# Patient Record
Sex: Male | Born: 1965
Health system: Southern US, Community
[De-identification: ages and names within clinical notes are randomized; demographics above are authoritative.]

## PROBLEM LIST (undated history)

## (undated) DIAGNOSIS — M1990 Unspecified osteoarthritis, unspecified site: Secondary | ICD-10-CM

## (undated) DIAGNOSIS — I10 Essential (primary) hypertension: Secondary | ICD-10-CM

## (undated) HISTORY — DX: Unspecified osteoarthritis, unspecified site: M19.90

## (undated) HISTORY — DX: Essential (primary) hypertension: I10

---

## 2000-08-09 ENCOUNTER — Ambulatory Visit (HOSPITAL_BASED_OUTPATIENT_CLINIC_OR_DEPARTMENT_OTHER): Admission: RE | Admit: 2000-08-09 | Discharge: 2000-08-09 | Payer: Self-pay | Admitting: Plastic Surgery

## 2013-02-20 ENCOUNTER — Other Ambulatory Visit: Payer: Self-pay | Admitting: Family Medicine

## 2013-02-20 DIAGNOSIS — M549 Dorsalgia, unspecified: Secondary | ICD-10-CM

## 2013-02-20 DIAGNOSIS — M5430 Sciatica, unspecified side: Secondary | ICD-10-CM

## 2013-03-05 ENCOUNTER — Ambulatory Visit
Admission: RE | Admit: 2013-03-05 | Discharge: 2013-03-05 | Disposition: A | Discharge status: Home / Self Care | Payer: No Typology Code available for payment source | Source: Ambulatory Visit | Attending: Family Medicine | Admitting: Family Medicine

## 2013-03-05 DIAGNOSIS — M549 Dorsalgia, unspecified: Secondary | ICD-10-CM

## 2013-03-05 DIAGNOSIS — M5430 Sciatica, unspecified side: Secondary | ICD-10-CM

## 2015-06-02 DIAGNOSIS — M4316 Spondylolisthesis, lumbar region: Secondary | ICD-10-CM | POA: Diagnosis not present

## 2015-06-16 DIAGNOSIS — K621 Rectal polyp: Secondary | ICD-10-CM | POA: Diagnosis not present

## 2015-06-16 DIAGNOSIS — Z1211 Encounter for screening for malignant neoplasm of colon: Secondary | ICD-10-CM | POA: Diagnosis not present

## 2015-06-16 DIAGNOSIS — D127 Benign neoplasm of rectosigmoid junction: Secondary | ICD-10-CM | POA: Diagnosis not present

## 2015-06-30 DIAGNOSIS — M4316 Spondylolisthesis, lumbar region: Secondary | ICD-10-CM | POA: Diagnosis not present

## 2015-06-30 DIAGNOSIS — I10 Essential (primary) hypertension: Secondary | ICD-10-CM | POA: Diagnosis not present

## 2015-07-31 DIAGNOSIS — H401111 Primary open-angle glaucoma, right eye, mild stage: Secondary | ICD-10-CM | POA: Diagnosis not present

## 2015-07-31 DIAGNOSIS — H401122 Primary open-angle glaucoma, left eye, moderate stage: Secondary | ICD-10-CM | POA: Diagnosis not present

## 2015-08-11 DIAGNOSIS — M4316 Spondylolisthesis, lumbar region: Secondary | ICD-10-CM | POA: Diagnosis not present

## 2015-09-04 DIAGNOSIS — L821 Other seborrheic keratosis: Secondary | ICD-10-CM | POA: Diagnosis not present

## 2015-09-04 DIAGNOSIS — D239 Other benign neoplasm of skin, unspecified: Secondary | ICD-10-CM | POA: Diagnosis not present

## 2015-09-08 DIAGNOSIS — M4316 Spondylolisthesis, lumbar region: Secondary | ICD-10-CM | POA: Diagnosis not present

## 2015-09-08 DIAGNOSIS — I10 Essential (primary) hypertension: Secondary | ICD-10-CM | POA: Diagnosis not present

## 2015-09-08 DIAGNOSIS — Z6825 Body mass index (BMI) 25.0-25.9, adult: Secondary | ICD-10-CM | POA: Diagnosis not present

## 2015-09-12 DIAGNOSIS — J209 Acute bronchitis, unspecified: Secondary | ICD-10-CM | POA: Diagnosis not present

## 2015-09-12 DIAGNOSIS — J0100 Acute maxillary sinusitis, unspecified: Secondary | ICD-10-CM | POA: Diagnosis not present

## 2015-11-24 DIAGNOSIS — M4316 Spondylolisthesis, lumbar region: Secondary | ICD-10-CM | POA: Diagnosis not present

## 2015-12-15 DIAGNOSIS — H01001 Unspecified blepharitis right upper eyelid: Secondary | ICD-10-CM | POA: Diagnosis not present

## 2015-12-15 DIAGNOSIS — H5213 Myopia, bilateral: Secondary | ICD-10-CM | POA: Diagnosis not present

## 2015-12-15 DIAGNOSIS — H01004 Unspecified blepharitis left upper eyelid: Secondary | ICD-10-CM | POA: Diagnosis not present

## 2016-02-04 DIAGNOSIS — B349 Viral infection, unspecified: Secondary | ICD-10-CM | POA: Diagnosis not present

## 2016-03-15 DIAGNOSIS — M4316 Spondylolisthesis, lumbar region: Secondary | ICD-10-CM | POA: Diagnosis not present

## 2016-04-12 DIAGNOSIS — I10 Essential (primary) hypertension: Secondary | ICD-10-CM | POA: Diagnosis not present

## 2016-04-12 DIAGNOSIS — M4316 Spondylolisthesis, lumbar region: Secondary | ICD-10-CM | POA: Diagnosis not present

## 2016-04-12 DIAGNOSIS — Z6825 Body mass index (BMI) 25.0-25.9, adult: Secondary | ICD-10-CM | POA: Diagnosis not present

## 2016-05-04 DIAGNOSIS — E7800 Pure hypercholesterolemia, unspecified: Secondary | ICD-10-CM | POA: Diagnosis not present

## 2016-05-04 DIAGNOSIS — R5382 Chronic fatigue, unspecified: Secondary | ICD-10-CM | POA: Diagnosis not present

## 2016-05-04 DIAGNOSIS — Z125 Encounter for screening for malignant neoplasm of prostate: Secondary | ICD-10-CM | POA: Diagnosis not present

## 2016-05-04 DIAGNOSIS — I10 Essential (primary) hypertension: Secondary | ICD-10-CM | POA: Diagnosis not present

## 2016-05-17 DIAGNOSIS — M4316 Spondylolisthesis, lumbar region: Secondary | ICD-10-CM | POA: Diagnosis not present

## 2016-06-28 DIAGNOSIS — Z6826 Body mass index (BMI) 26.0-26.9, adult: Secondary | ICD-10-CM | POA: Diagnosis not present

## 2016-06-28 DIAGNOSIS — M4316 Spondylolisthesis, lumbar region: Secondary | ICD-10-CM | POA: Diagnosis not present

## 2016-07-16 DIAGNOSIS — I10 Essential (primary) hypertension: Secondary | ICD-10-CM | POA: Diagnosis not present

## 2016-07-26 DIAGNOSIS — I10 Essential (primary) hypertension: Secondary | ICD-10-CM | POA: Diagnosis not present

## 2016-08-09 DIAGNOSIS — M4316 Spondylolisthesis, lumbar region: Secondary | ICD-10-CM | POA: Diagnosis not present

## 2016-11-09 DIAGNOSIS — M4316 Spondylolisthesis, lumbar region: Secondary | ICD-10-CM | POA: Diagnosis not present

## 2016-12-14 DIAGNOSIS — I10 Essential (primary) hypertension: Secondary | ICD-10-CM | POA: Diagnosis not present

## 2016-12-14 DIAGNOSIS — Z6825 Body mass index (BMI) 25.0-25.9, adult: Secondary | ICD-10-CM | POA: Diagnosis not present

## 2016-12-14 DIAGNOSIS — M4316 Spondylolisthesis, lumbar region: Secondary | ICD-10-CM | POA: Diagnosis not present

## 2017-02-21 DIAGNOSIS — H01001 Unspecified blepharitis right upper eyelid: Secondary | ICD-10-CM | POA: Diagnosis not present

## 2017-02-21 DIAGNOSIS — H5213 Myopia, bilateral: Secondary | ICD-10-CM | POA: Diagnosis not present

## 2017-06-17 DIAGNOSIS — I10 Essential (primary) hypertension: Secondary | ICD-10-CM | POA: Diagnosis not present

## 2017-06-17 DIAGNOSIS — Z0000 Encounter for general adult medical examination without abnormal findings: Secondary | ICD-10-CM | POA: Diagnosis not present

## 2017-06-17 DIAGNOSIS — E7800 Pure hypercholesterolemia, unspecified: Secondary | ICD-10-CM | POA: Diagnosis not present

## 2017-06-17 DIAGNOSIS — Z125 Encounter for screening for malignant neoplasm of prostate: Secondary | ICD-10-CM | POA: Diagnosis not present

## 2018-04-03 DIAGNOSIS — H52203 Unspecified astigmatism, bilateral: Secondary | ICD-10-CM | POA: Diagnosis not present

## 2018-04-03 DIAGNOSIS — H01004 Unspecified blepharitis left upper eyelid: Secondary | ICD-10-CM | POA: Diagnosis not present

## 2018-04-03 DIAGNOSIS — H01001 Unspecified blepharitis right upper eyelid: Secondary | ICD-10-CM | POA: Diagnosis not present

## 2018-04-03 DIAGNOSIS — H524 Presbyopia: Secondary | ICD-10-CM | POA: Diagnosis not present

## 2018-04-06 DIAGNOSIS — L820 Inflamed seborrheic keratosis: Secondary | ICD-10-CM | POA: Diagnosis not present

## 2018-04-06 DIAGNOSIS — L570 Actinic keratosis: Secondary | ICD-10-CM | POA: Diagnosis not present

## 2018-04-06 DIAGNOSIS — D229 Melanocytic nevi, unspecified: Secondary | ICD-10-CM | POA: Diagnosis not present

## 2018-09-22 DIAGNOSIS — Z125 Encounter for screening for malignant neoplasm of prostate: Secondary | ICD-10-CM | POA: Diagnosis not present

## 2018-09-22 DIAGNOSIS — I10 Essential (primary) hypertension: Secondary | ICD-10-CM | POA: Diagnosis not present

## 2018-09-22 DIAGNOSIS — M47816 Spondylosis without myelopathy or radiculopathy, lumbar region: Secondary | ICD-10-CM | POA: Diagnosis not present

## 2018-09-22 DIAGNOSIS — E7800 Pure hypercholesterolemia, unspecified: Secondary | ICD-10-CM | POA: Diagnosis not present

## 2019-01-05 DIAGNOSIS — Z20828 Contact with and (suspected) exposure to other viral communicable diseases: Secondary | ICD-10-CM | POA: Diagnosis not present

## 2019-04-09 DIAGNOSIS — H5213 Myopia, bilateral: Secondary | ICD-10-CM | POA: Diagnosis not present

## 2019-04-09 DIAGNOSIS — H01001 Unspecified blepharitis right upper eyelid: Secondary | ICD-10-CM | POA: Diagnosis not present

## 2019-04-09 DIAGNOSIS — H01004 Unspecified blepharitis left upper eyelid: Secondary | ICD-10-CM | POA: Diagnosis not present

## 2019-04-09 DIAGNOSIS — H52203 Unspecified astigmatism, bilateral: Secondary | ICD-10-CM | POA: Diagnosis not present

## 2019-04-11 ENCOUNTER — Encounter: Payer: Self-pay | Admitting: *Deleted

## 2019-04-30 ENCOUNTER — Encounter: Payer: Self-pay | Admitting: Physician Assistant

## 2019-04-30 ENCOUNTER — Ambulatory Visit: Payer: Self-pay | Admitting: Physician Assistant

## 2019-04-30 ENCOUNTER — Other Ambulatory Visit: Payer: Self-pay

## 2019-04-30 DIAGNOSIS — Z1283 Encounter for screening for malignant neoplasm of skin: Secondary | ICD-10-CM

## 2019-04-30 DIAGNOSIS — L570 Actinic keratosis: Secondary | ICD-10-CM

## 2019-04-30 NOTE — Patient Instructions (Signed)

## 2019-04-30 NOTE — Progress Notes (Signed)
   Follow-Up Visit   Subjective  James Lawrence is a 54 y.o. male who presents for the following: Annual Exam (left cheek scale). It is sensitive  The following portions of the chart were reviewed this encounter and updated as appropriate: Allergies  Meds  Problems  Med Hx  Surg Hx  Fam Hx      Review of Systems: No other skin or systemic complaints.  Objective  Well appearing patient in no apparent distress; mood and affect are within normal limits.  Skin exam was performed waist up.   Objective  Left Temporal Scalp (2), Left Zygomatic Area, Right Temple, Right Zygomatic Area (2): Erythematous patches with gritty scale.  Assessment & Plan  AK (actinic keratosis) (6) Right Temple; Left Zygomatic Area; Right Zygomatic Area (2); Left Temporal Scalp (2)  Destruction of lesion - Left Temporal Scalp (2), Left Zygomatic Area, Right Temple, Right Zygomatic Area (2) Complexity: simple   Destruction method: cryotherapy   Timeout:  patient name, date of birth, surgical site, and procedure verified Lesion destroyed using liquid nitrogen: Yes   Cryotherapy cycles:  1 Lesion length (cm):  0.3 Lesion width (cm):  0.3 (.3) Margin per side (cm):  0.1 (.3) Final wound size (cm):  0.5 Outcome: patient tolerated procedure well with no complications   Post-procedure details: wound care instructions given    Skin exam for malignant neoplasm No suspicious nevi waist up.

## 2019-11-02 ENCOUNTER — Encounter: Payer: Self-pay | Admitting: Physician Assistant

## 2019-11-02 ENCOUNTER — Ambulatory Visit: Payer: BC Managed Care – PPO | Admitting: Physician Assistant

## 2019-11-02 ENCOUNTER — Other Ambulatory Visit: Payer: Self-pay

## 2019-11-02 DIAGNOSIS — Z1283 Encounter for screening for malignant neoplasm of skin: Secondary | ICD-10-CM

## 2019-11-02 DIAGNOSIS — D485 Neoplasm of uncertain behavior of skin: Secondary | ICD-10-CM

## 2019-11-02 DIAGNOSIS — L578 Other skin changes due to chronic exposure to nonionizing radiation: Secondary | ICD-10-CM | POA: Diagnosis not present

## 2019-11-02 DIAGNOSIS — L570 Actinic keratosis: Secondary | ICD-10-CM | POA: Diagnosis not present

## 2019-11-02 MED ORDER — FLUOROURACIL 5 % EX CREA: TOPICAL_CREAM | Freq: Every day | CUTANEOUS | 1 refills | Status: AC

## 2019-11-02 NOTE — Patient Instructions (Signed)

## 2019-11-02 NOTE — Progress Notes (Signed)
   Follow-Up Visit   Subjective  James Lawrence is a 54 y.o. male who presents for the following: Follow-up (from 3/15 visit-- AK on the face--concern a spot at right collar bone (noticed off and on for 3 months)).   The following portions of the chart were reviewed this encounter and updated as appropriate: Tobacco  Allergies  Meds  Problems  Med Hx  Surg Hx  Fam Hx      Objective  Well appearing patient in no apparent distress; mood and affect are within normal limits.  A focused examination was performed including waist up and legs. Relevant physical exam findings are noted in the Assessment and Plan.  Objective  right upper chest: Hyperkeratotic scale with pink base    Objective  Left Temple, Right Temple: Diffuse rough, slight xerosis and erythema  Objective  waist up and legs: No atypical nevi   Assessment & Plan  Neoplasm of uncertain behavior of skin right upper chest  Epidermal / dermal shaving  Lesion diameter (cm):  1 Informed consent: discussed and consent obtained   Timeout: patient name, date of birth, surgical site, and procedure verified   Procedure prep:  Patient was prepped and draped in usual sterile fashion Prep type:  Chlorhexidine Anesthesia: the lesion was anesthetized in a standard fashion   Anesthetic:  1% lidocaine w/ epinephrine 1-100,000 local infiltration Instrument used: DermaBlade   Hemostasis achieved with: aluminum chloride   Outcome: patient tolerated procedure well   Post-procedure details: sterile dressing applied and wound care instructions given   Dressing type: petrolatum gauze, petrolatum and bandage    Specimen 1 - Surgical pathology Differential Diagnosis: scc Check Margins: yes  Actinic skin damage (2) Left Temple; Right Temple  fluorouracil (EFUDEX) 5 % cream - Left Temple, Right Temple  Screening exam for skin cancer waist up and legs  Yearly skin exams   I, Nassir Neidert, PA-C, have reviewed all  documentation's for this visit.  The documentation on 11/12/19 for the exam, diagnosis, procedures and orders are all accurate and complete.

## 2019-11-22 DIAGNOSIS — I10 Essential (primary) hypertension: Secondary | ICD-10-CM | POA: Diagnosis not present

## 2019-11-22 DIAGNOSIS — E7800 Pure hypercholesterolemia, unspecified: Secondary | ICD-10-CM | POA: Diagnosis not present

## 2019-11-22 DIAGNOSIS — Z23 Encounter for immunization: Secondary | ICD-10-CM | POA: Diagnosis not present

## 2019-11-22 DIAGNOSIS — Z125 Encounter for screening for malignant neoplasm of prostate: Secondary | ICD-10-CM | POA: Diagnosis not present

## 2019-11-26 ENCOUNTER — Telehealth: Payer: Self-pay | Admitting: Physician Assistant

## 2019-11-26 NOTE — Telephone Encounter (Signed)
Patient is calling for pathology results from last visit with Kelli Sheffield, PA-C. 

## 2019-11-26 NOTE — Telephone Encounter (Signed)
Path to patient  

## 2020-03-14 DIAGNOSIS — Z23 Encounter for immunization: Secondary | ICD-10-CM | POA: Diagnosis not present

## 2020-04-28 DIAGNOSIS — R5382 Chronic fatigue, unspecified: Secondary | ICD-10-CM | POA: Diagnosis not present

## 2020-04-28 DIAGNOSIS — R0602 Shortness of breath: Secondary | ICD-10-CM | POA: Diagnosis not present

## 2020-04-28 DIAGNOSIS — H5213 Myopia, bilateral: Secondary | ICD-10-CM | POA: Diagnosis not present

## 2020-04-28 DIAGNOSIS — H01001 Unspecified blepharitis right upper eyelid: Secondary | ICD-10-CM | POA: Diagnosis not present

## 2020-04-28 DIAGNOSIS — H524 Presbyopia: Secondary | ICD-10-CM | POA: Diagnosis not present

## 2020-04-28 DIAGNOSIS — H01004 Unspecified blepharitis left upper eyelid: Secondary | ICD-10-CM | POA: Diagnosis not present

## 2020-04-29 ENCOUNTER — Ambulatory Visit
Admission: RE | Admit: 2020-04-29 | Discharge: 2020-04-29 | Disposition: A | Discharge status: Home / Self Care | Payer: BC Managed Care – PPO | Source: Ambulatory Visit | Attending: Family Medicine | Admitting: Family Medicine

## 2020-04-29 ENCOUNTER — Other Ambulatory Visit: Payer: Self-pay | Admitting: Family Medicine

## 2020-04-29 ENCOUNTER — Other Ambulatory Visit: Payer: Self-pay

## 2020-04-29 DIAGNOSIS — R0602 Shortness of breath: Secondary | ICD-10-CM

## 2020-09-04 DIAGNOSIS — E7800 Pure hypercholesterolemia, unspecified: Secondary | ICD-10-CM | POA: Diagnosis not present

## 2020-09-04 DIAGNOSIS — L659 Nonscarring hair loss, unspecified: Secondary | ICD-10-CM | POA: Diagnosis not present

## 2020-09-04 DIAGNOSIS — I10 Essential (primary) hypertension: Secondary | ICD-10-CM | POA: Diagnosis not present

## 2020-09-04 DIAGNOSIS — Z125 Encounter for screening for malignant neoplasm of prostate: Secondary | ICD-10-CM | POA: Diagnosis not present

## 2020-10-06 ENCOUNTER — Other Ambulatory Visit (HOSPITAL_COMMUNITY): Payer: Self-pay | Admitting: Family Medicine

## 2020-10-15 ENCOUNTER — Other Ambulatory Visit: Payer: Self-pay

## 2020-10-15 ENCOUNTER — Ambulatory Visit (HOSPITAL_COMMUNITY)
Admission: RE | Admit: 2020-10-15 | Discharge: 2020-10-15 | Disposition: A | Discharge status: Home / Self Care | Payer: Self-pay | Source: Ambulatory Visit | Attending: Family Medicine | Admitting: Family Medicine

## 2020-10-15 DIAGNOSIS — Z136 Encounter for screening for cardiovascular disorders: Secondary | ICD-10-CM | POA: Insufficient documentation

## 2020-10-15 IMAGING — CT CT CARDIAC CORONARY ARTERY CALCIUM SCORE
2 series · 15 of 20 positions shown, 17 images · non-contrast
Comparison: None.
COMPARISON: None.

Addendum:
EXAM:
OVER-READ INTERPRETATION  CT CHEST

The following report is an over-read performed by radiologist Dr.
HALIMATUL [REDACTED] on [DATE]. This
over-read does not include interpretation of cardiac or coronary
anatomy or pathology. The coronary calcium score interpretation by
the cardiologist is attached.
CLINICAL DATA: Cardiovascular Disease Risk stratification
Coronary Calcium Score
TECHNIQUE: A gated, non-contrast computed tomography scan of the heart was
performed using 3mm slice thickness. Axial images were analyzed on a
dedicated workstation. Calcium scoring of the coronary arteries was
performed using the Agatston method.

[Series 3: cascseq 2.0 b35f 70% · axial · 0.38mm/px · z∈[+1127,+1217]mm · 7 of 69 slices shown]
[im 8/69  vessel]
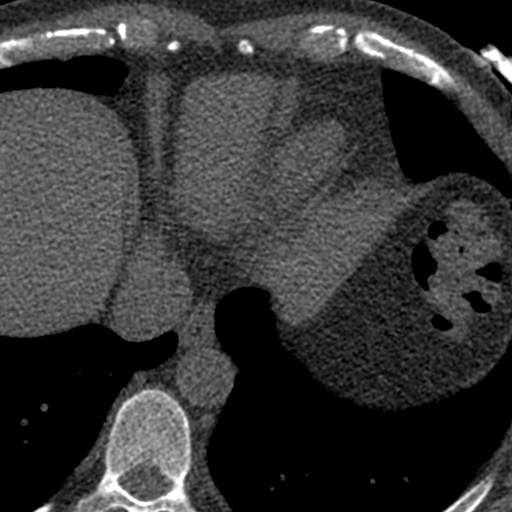
[im 16/69  vessel]
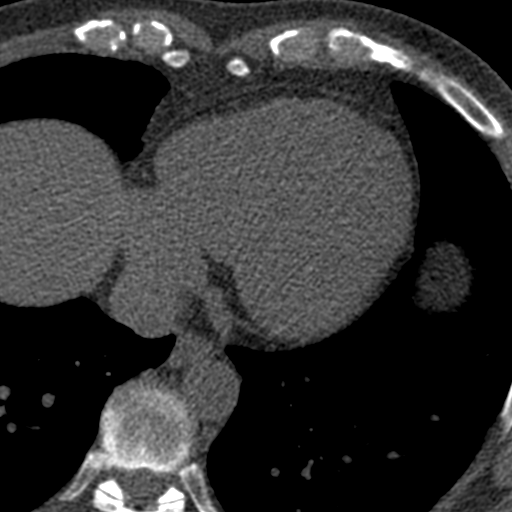
[im 23/69  vessel]
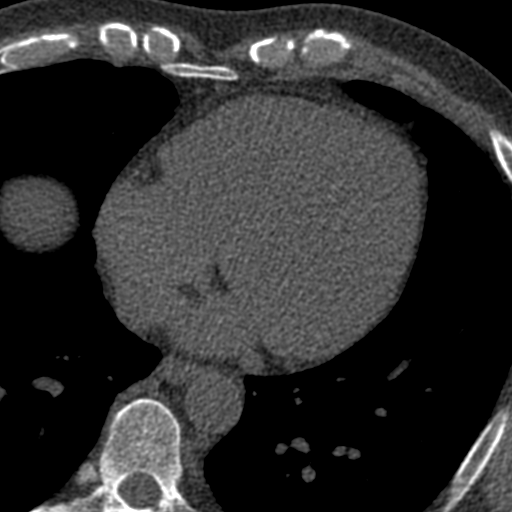
[im 31/69  vessel]
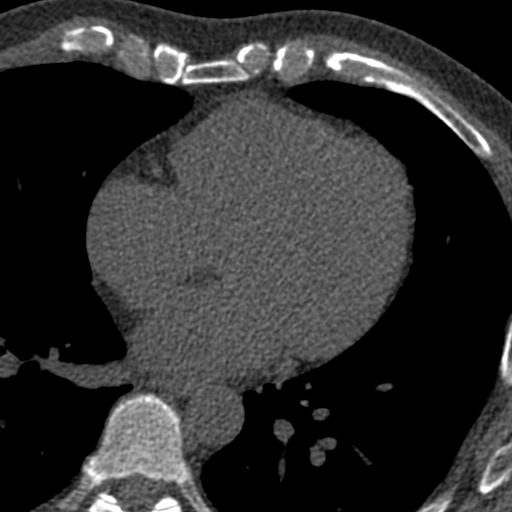
[im 38/69  vessel]
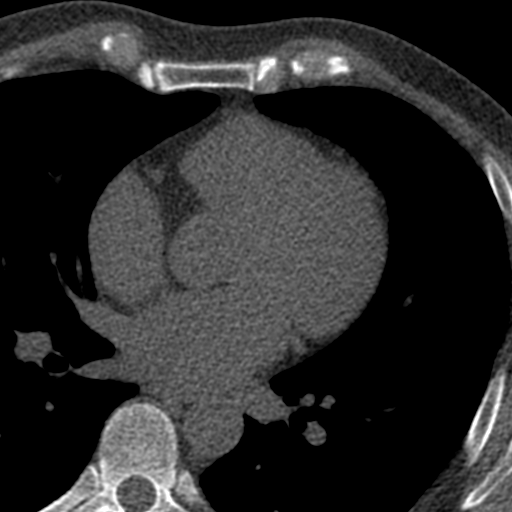
[im 46/69  vessel]
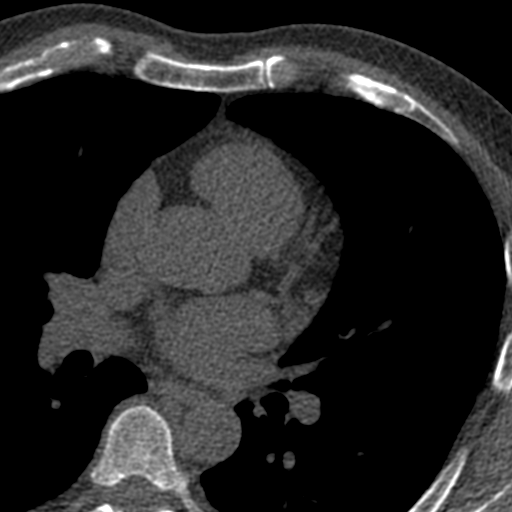
[im 53/69  vessel]
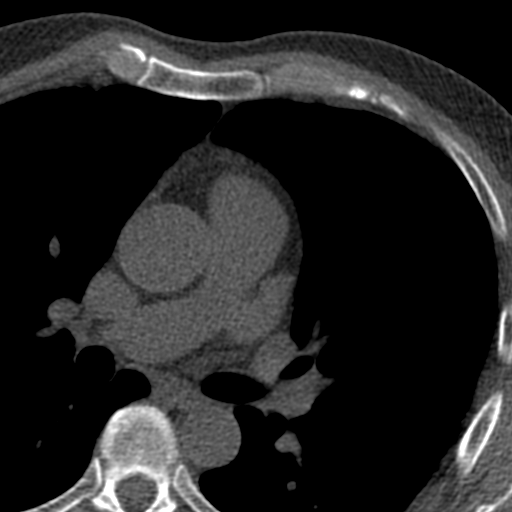

[Series 4: ax st full fov · axial · 0.79mm/px · z∈[+1127,+1233]mm · 8 of 69 slices shown, 10 images]
[im 8/69  vessel]
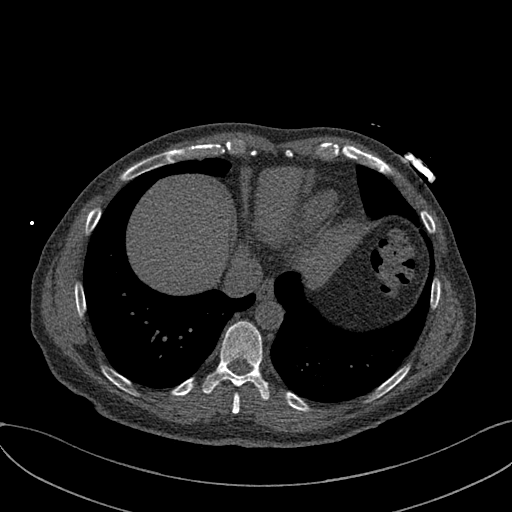
[im 8/69  lung]
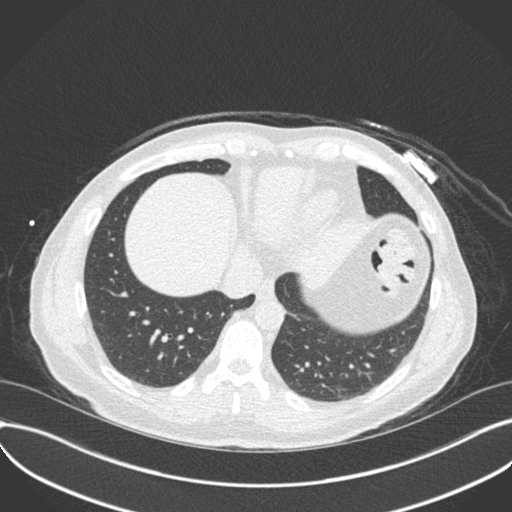
[im 16/69  vessel]
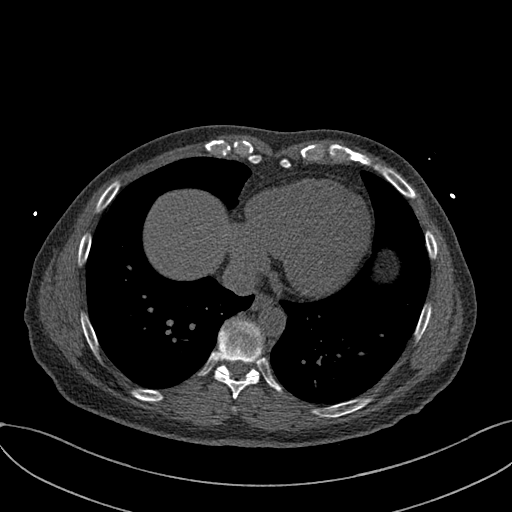
[im 23/69  vessel]
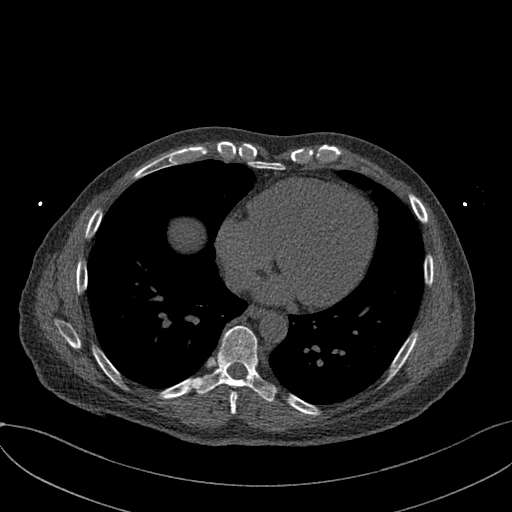
[im 31/69  vessel]
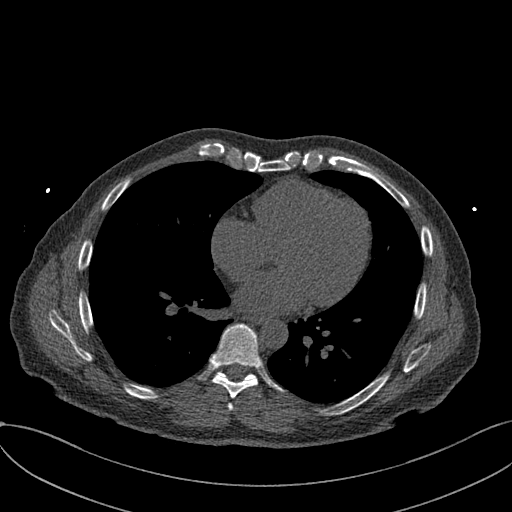
[im 38/69  vessel]
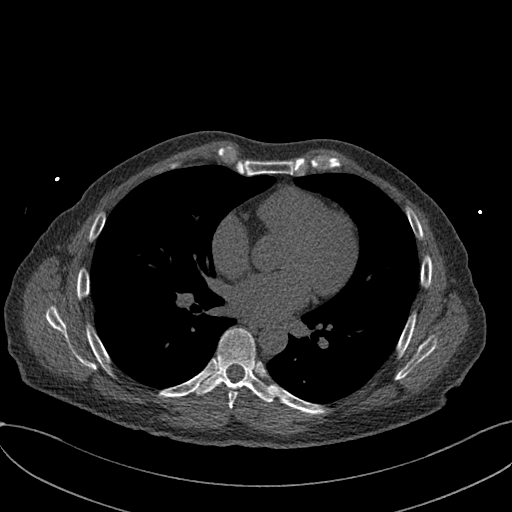
[im 38/69  lung]
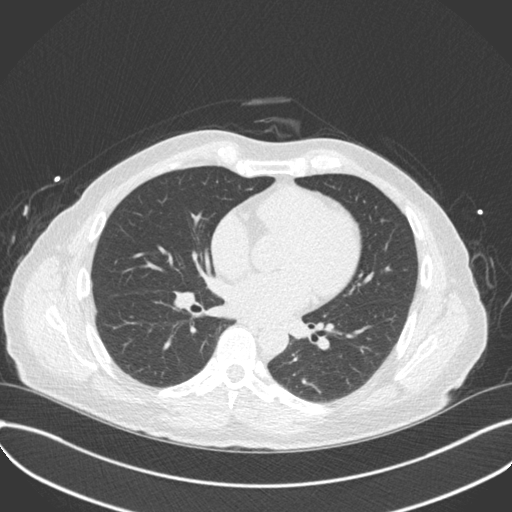
[im 46/69  vessel]
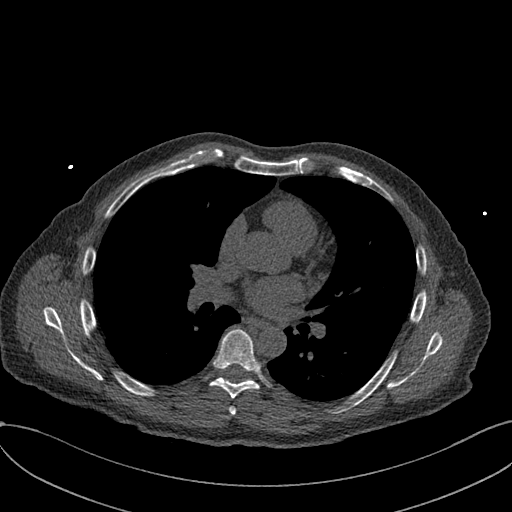
[im 53/69  vessel]
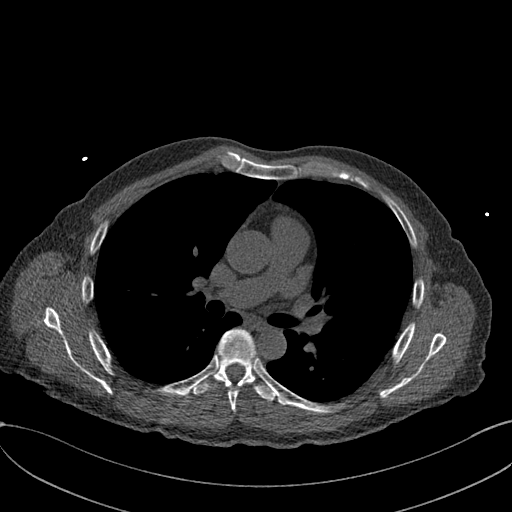
[im 61/69  vessel]
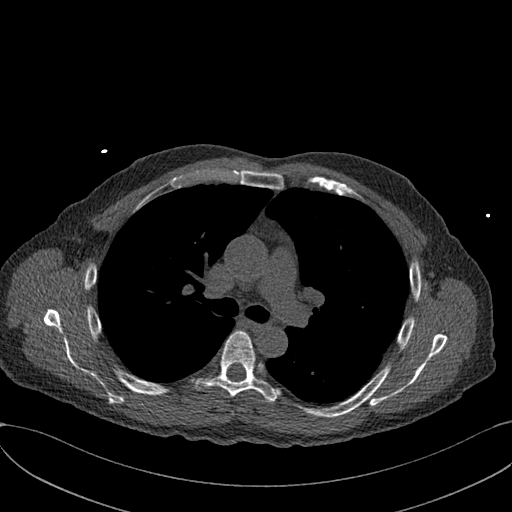

[15 of 20 positions shown; findings below may reference images not displayed]

FINDINGS: Vascular: No significant noncardiac vascular findings.

Mediastinum/Nodes: Visualized mediastinum and hilar regions
demonstrate no lymphadenopathy or masses.

Lungs/Pleura: Visualized lungs show no evidence of pulmonary edema,
consolidation, pneumothorax, nodule or pleural fluid.

Upper Abdomen: No acute abnormality.

Musculoskeletal: No chest wall mass or suspicious bone lesions
identified.
IMPRESSION: No significant incidental findings.
FINDINGS: Coronary arteries: Normal origins.

Coronary Calcium Score:

Left main: 0

Left anterior descending artery: 1

Left circumflex artery: 20

Right coronary artery: 0

Total: 22

Percentile: 57

Pericardium: Normal.

Ascending Aorta: Normal caliber.

Non-cardiac: See separate report from [REDACTED].
IMPRESSION: Coronary calcium score of 22. This was 57 percentile for age-,
race-, and sex-matched controls.



If CAC=0, it is reasonable to withhold statin therapy and reassess
in 5 to 10 years, as long as higher risk conditions are absent
(diabetes mellitus, family history of premature CHD in first degree
relatives (males <55 years; females <65 years), cigarette smoking,
or LDL >=190 mg/dL).

If CAC is 1 to 99, it is reasonable to initiate statin therapy for
patients >=55 years of age.

If CAC is >=100 or >=75th percentile, it is reasonable to initiate
statin therapy at any age.

Cardiology referral should be considered for patients with CAC
scores >=400 or >=75th percentile.

*[8F] AHA/ACC/AACVPR/AAPA/ABC/HALIMATUL/HALIMATUL/HALIMATUL/HALIMATUL/HALIMATUL/HALIMATUL/HALIMATUL
Guideline on the Management of Blood Cholesterol: A Report of the
American College of Cardiology/American Heart Association Task Force
on Clinical Practice Guidelines. J Am Coll Cardiol.
[8F];73(24):[PHONE_NUMBER].

*** End of Addendum ***
EXAM:
OVER-READ INTERPRETATION  CT CHEST

The following report is an over-read performed by radiologist Dr.
HALIMATUL [REDACTED] on [DATE]. This
over-read does not include interpretation of cardiac or coronary
anatomy or pathology. The coronary calcium score interpretation by
the cardiologist is attached.
FINDINGS: Vascular: No significant noncardiac vascular findings.

Mediastinum/Nodes: Visualized mediastinum and hilar regions
demonstrate no lymphadenopathy or masses.

Lungs/Pleura: Visualized lungs show no evidence of pulmonary edema,
consolidation, pneumothorax, nodule or pleural fluid.

Upper Abdomen: No acute abnormality.

Musculoskeletal: No chest wall mass or suspicious bone lesions
identified.
IMPRESSION: No significant incidental findings.

## 2021-01-29 ENCOUNTER — Encounter: Payer: Self-pay | Admitting: Physician Assistant

## 2021-01-29 ENCOUNTER — Other Ambulatory Visit: Payer: Self-pay

## 2021-01-29 ENCOUNTER — Ambulatory Visit: Payer: BC Managed Care – PPO | Admitting: Physician Assistant

## 2021-01-29 DIAGNOSIS — Z1283 Encounter for screening for malignant neoplasm of skin: Secondary | ICD-10-CM | POA: Diagnosis not present

## 2021-01-29 DIAGNOSIS — L570 Actinic keratosis: Secondary | ICD-10-CM

## 2021-01-29 NOTE — Progress Notes (Signed)
° °  Follow-Up Visit   Subjective  James Lawrence is a 55 y.o. male who presents for the following: Annual Exam (New Left post neck scale x months, follow up patient stated he did treat his face with efudex and it wasn't to bad.).   The following portions of the chart were reviewed this encounter and updated as appropriate:  Tobacco   Allergies   Meds   Problems   Med Hx   Surg Hx   Fam Hx       Objective  Well appearing patient in no apparent distress; mood and affect are within normal limits.  All skin waist up examined.  Neck - Anterior, Nose, forehead (2) Erythematous patches with gritty scale.  Assessment & Plan  AK (actinic keratosis) (4) forehead (2); Nose; Neck - Anterior  Destruction of lesion - Neck - Anterior, Nose, forehead Complexity: simple   Destruction method: cryotherapy   Informed consent: discussed and consent obtained   Timeout:  patient name, date of birth, surgical site, and procedure verified Lesion destroyed using liquid nitrogen: Yes   Cryotherapy cycles:  3 Outcome: patient tolerated procedure well with no complications   Post-procedure details: wound care instructions given     I, Kodee Ravert, PA-C, have reviewed all documentation's for this visit.  The documentation on 01/29/21 for the exam, diagnosis, procedures and orders are all accurate and complete.

## 2021-03-30 DIAGNOSIS — H01004 Unspecified blepharitis left upper eyelid: Secondary | ICD-10-CM | POA: Diagnosis not present

## 2021-03-30 DIAGNOSIS — H5213 Myopia, bilateral: Secondary | ICD-10-CM | POA: Diagnosis not present

## 2021-03-30 DIAGNOSIS — H52203 Unspecified astigmatism, bilateral: Secondary | ICD-10-CM | POA: Diagnosis not present

## 2021-03-30 DIAGNOSIS — H01001 Unspecified blepharitis right upper eyelid: Secondary | ICD-10-CM | POA: Diagnosis not present

## 2021-05-29 DIAGNOSIS — I10 Essential (primary) hypertension: Secondary | ICD-10-CM | POA: Diagnosis not present

## 2021-05-29 DIAGNOSIS — Z91030 Bee allergy status: Secondary | ICD-10-CM | POA: Diagnosis not present

## 2021-05-29 DIAGNOSIS — L659 Nonscarring hair loss, unspecified: Secondary | ICD-10-CM | POA: Diagnosis not present

## 2021-05-29 DIAGNOSIS — E7800 Pure hypercholesterolemia, unspecified: Secondary | ICD-10-CM | POA: Diagnosis not present

## 2021-05-29 DIAGNOSIS — Z125 Encounter for screening for malignant neoplasm of prostate: Secondary | ICD-10-CM | POA: Diagnosis not present

## 2021-07-17 DIAGNOSIS — M549 Dorsalgia, unspecified: Secondary | ICD-10-CM | POA: Diagnosis not present

## 2021-08-06 DIAGNOSIS — S62357A Nondisplaced fracture of shaft of fifth metacarpal bone, left hand, initial encounter for closed fracture: Secondary | ICD-10-CM | POA: Diagnosis not present

## 2021-08-07 DIAGNOSIS — M47812 Spondylosis without myelopathy or radiculopathy, cervical region: Secondary | ICD-10-CM | POA: Diagnosis not present

## 2021-08-07 DIAGNOSIS — M4316 Spondylolisthesis, lumbar region: Secondary | ICD-10-CM | POA: Diagnosis not present

## 2021-08-07 DIAGNOSIS — M25642 Stiffness of left hand, not elsewhere classified: Secondary | ICD-10-CM | POA: Diagnosis not present

## 2021-08-07 DIAGNOSIS — M5412 Radiculopathy, cervical region: Secondary | ICD-10-CM | POA: Diagnosis not present

## 2021-08-07 DIAGNOSIS — S62357A Nondisplaced fracture of shaft of fifth metacarpal bone, left hand, initial encounter for closed fracture: Secondary | ICD-10-CM | POA: Diagnosis not present

## 2021-08-07 DIAGNOSIS — M79645 Pain in left finger(s): Secondary | ICD-10-CM | POA: Diagnosis not present

## 2021-08-14 DIAGNOSIS — M1812 Unilateral primary osteoarthritis of first carpometacarpal joint, left hand: Secondary | ICD-10-CM | POA: Diagnosis not present

## 2021-08-14 DIAGNOSIS — S62357A Nondisplaced fracture of shaft of fifth metacarpal bone, left hand, initial encounter for closed fracture: Secondary | ICD-10-CM | POA: Diagnosis not present

## 2021-08-24 DIAGNOSIS — S62357A Nondisplaced fracture of shaft of fifth metacarpal bone, left hand, initial encounter for closed fracture: Secondary | ICD-10-CM | POA: Diagnosis not present

## 2021-08-25 DIAGNOSIS — M48061 Spinal stenosis, lumbar region without neurogenic claudication: Secondary | ICD-10-CM | POA: Diagnosis not present

## 2021-08-25 DIAGNOSIS — M5031 Other cervical disc degeneration,  high cervical region: Secondary | ICD-10-CM | POA: Diagnosis not present

## 2021-08-25 DIAGNOSIS — M47816 Spondylosis without myelopathy or radiculopathy, lumbar region: Secondary | ICD-10-CM | POA: Diagnosis not present

## 2021-08-25 DIAGNOSIS — M4316 Spondylolisthesis, lumbar region: Secondary | ICD-10-CM | POA: Diagnosis not present

## 2021-08-25 DIAGNOSIS — M5126 Other intervertebral disc displacement, lumbar region: Secondary | ICD-10-CM | POA: Diagnosis not present

## 2021-08-25 DIAGNOSIS — M4802 Spinal stenosis, cervical region: Secondary | ICD-10-CM | POA: Diagnosis not present

## 2021-08-25 DIAGNOSIS — M47812 Spondylosis without myelopathy or radiculopathy, cervical region: Secondary | ICD-10-CM | POA: Diagnosis not present

## 2021-09-02 DIAGNOSIS — M5412 Radiculopathy, cervical region: Secondary | ICD-10-CM | POA: Diagnosis not present

## 2021-09-02 DIAGNOSIS — Z6825 Body mass index (BMI) 25.0-25.9, adult: Secondary | ICD-10-CM | POA: Diagnosis not present

## 2021-09-02 DIAGNOSIS — M4316 Spondylolisthesis, lumbar region: Secondary | ICD-10-CM | POA: Diagnosis not present

## 2021-09-14 DIAGNOSIS — S62357A Nondisplaced fracture of shaft of fifth metacarpal bone, left hand, initial encounter for closed fracture: Secondary | ICD-10-CM | POA: Diagnosis not present

## 2021-10-12 DIAGNOSIS — S62357A Nondisplaced fracture of shaft of fifth metacarpal bone, left hand, initial encounter for closed fracture: Secondary | ICD-10-CM | POA: Diagnosis not present

## 2021-11-12 DIAGNOSIS — Z0000 Encounter for general adult medical examination without abnormal findings: Secondary | ICD-10-CM | POA: Diagnosis not present

## 2021-11-12 DIAGNOSIS — I10 Essential (primary) hypertension: Secondary | ICD-10-CM | POA: Diagnosis not present

## 2021-11-12 DIAGNOSIS — Z23 Encounter for immunization: Secondary | ICD-10-CM | POA: Diagnosis not present

## 2021-11-12 DIAGNOSIS — E7800 Pure hypercholesterolemia, unspecified: Secondary | ICD-10-CM | POA: Diagnosis not present

## 2022-02-02 ENCOUNTER — Ambulatory Visit: Payer: BC Managed Care – PPO | Admitting: Physician Assistant

## 2022-03-03 DIAGNOSIS — M47812 Spondylosis without myelopathy or radiculopathy, cervical region: Secondary | ICD-10-CM | POA: Diagnosis not present

## 2022-03-03 DIAGNOSIS — Z6826 Body mass index (BMI) 26.0-26.9, adult: Secondary | ICD-10-CM | POA: Diagnosis not present

## 2022-03-03 DIAGNOSIS — M4316 Spondylolisthesis, lumbar region: Secondary | ICD-10-CM | POA: Diagnosis not present

## 2022-03-03 DIAGNOSIS — M5412 Radiculopathy, cervical region: Secondary | ICD-10-CM | POA: Diagnosis not present

## 2022-04-14 DIAGNOSIS — H52203 Unspecified astigmatism, bilateral: Secondary | ICD-10-CM | POA: Diagnosis not present

## 2022-04-14 DIAGNOSIS — H5213 Myopia, bilateral: Secondary | ICD-10-CM | POA: Diagnosis not present

## 2022-04-14 DIAGNOSIS — H01004 Unspecified blepharitis left upper eyelid: Secondary | ICD-10-CM | POA: Diagnosis not present

## 2022-04-14 DIAGNOSIS — H01001 Unspecified blepharitis right upper eyelid: Secondary | ICD-10-CM | POA: Diagnosis not present

## 2022-07-19 DIAGNOSIS — M47812 Spondylosis without myelopathy or radiculopathy, cervical region: Secondary | ICD-10-CM | POA: Diagnosis not present

## 2022-07-19 DIAGNOSIS — M542 Cervicalgia: Secondary | ICD-10-CM | POA: Diagnosis not present

## 2022-08-11 ENCOUNTER — Other Ambulatory Visit (HOSPITAL_BASED_OUTPATIENT_CLINIC_OR_DEPARTMENT_OTHER): Payer: Self-pay | Admitting: Surgery

## 2022-08-11 DIAGNOSIS — M542 Cervicalgia: Secondary | ICD-10-CM

## 2022-08-13 ENCOUNTER — Ambulatory Visit (HOSPITAL_BASED_OUTPATIENT_CLINIC_OR_DEPARTMENT_OTHER): Payer: BC Managed Care – PPO

## 2022-08-24 DIAGNOSIS — M4802 Spinal stenosis, cervical region: Secondary | ICD-10-CM | POA: Diagnosis not present

## 2022-08-24 DIAGNOSIS — M47812 Spondylosis without myelopathy or radiculopathy, cervical region: Secondary | ICD-10-CM | POA: Diagnosis not present

## 2022-09-20 DIAGNOSIS — M47812 Spondylosis without myelopathy or radiculopathy, cervical region: Secondary | ICD-10-CM | POA: Diagnosis not present

## 2022-09-20 DIAGNOSIS — Z6826 Body mass index (BMI) 26.0-26.9, adult: Secondary | ICD-10-CM | POA: Diagnosis not present

## 2022-09-20 DIAGNOSIS — M542 Cervicalgia: Secondary | ICD-10-CM | POA: Diagnosis not present

## 2022-10-05 DIAGNOSIS — M5412 Radiculopathy, cervical region: Secondary | ICD-10-CM | POA: Diagnosis not present

## 2022-11-18 DIAGNOSIS — Z125 Encounter for screening for malignant neoplasm of prostate: Secondary | ICD-10-CM | POA: Diagnosis not present

## 2022-11-18 DIAGNOSIS — E7800 Pure hypercholesterolemia, unspecified: Secondary | ICD-10-CM | POA: Diagnosis not present

## 2022-11-18 DIAGNOSIS — Z0000 Encounter for general adult medical examination without abnormal findings: Secondary | ICD-10-CM | POA: Diagnosis not present

## 2022-11-18 DIAGNOSIS — I10 Essential (primary) hypertension: Secondary | ICD-10-CM | POA: Diagnosis not present

## 2022-11-23 DIAGNOSIS — M4316 Spondylolisthesis, lumbar region: Secondary | ICD-10-CM | POA: Diagnosis not present

## 2022-11-23 DIAGNOSIS — Z6826 Body mass index (BMI) 26.0-26.9, adult: Secondary | ICD-10-CM | POA: Diagnosis not present

## 2022-12-02 DIAGNOSIS — M5416 Radiculopathy, lumbar region: Secondary | ICD-10-CM | POA: Diagnosis not present

## 2022-12-02 DIAGNOSIS — M4316 Spondylolisthesis, lumbar region: Secondary | ICD-10-CM | POA: Diagnosis not present

## 2022-12-21 DIAGNOSIS — G43909 Migraine, unspecified, not intractable, without status migrainosus: Secondary | ICD-10-CM | POA: Diagnosis not present

## 2023-03-07 DIAGNOSIS — M4316 Spondylolisthesis, lumbar region: Secondary | ICD-10-CM | POA: Diagnosis not present

## 2023-03-07 DIAGNOSIS — Z6825 Body mass index (BMI) 25.0-25.9, adult: Secondary | ICD-10-CM | POA: Diagnosis not present

## 2023-03-07 DIAGNOSIS — M5416 Radiculopathy, lumbar region: Secondary | ICD-10-CM | POA: Diagnosis not present

## 2023-04-18 DIAGNOSIS — H01001 Unspecified blepharitis right upper eyelid: Secondary | ICD-10-CM | POA: Diagnosis not present

## 2023-04-18 DIAGNOSIS — H5213 Myopia, bilateral: Secondary | ICD-10-CM | POA: Diagnosis not present

## 2023-04-18 DIAGNOSIS — H52203 Unspecified astigmatism, bilateral: Secondary | ICD-10-CM | POA: Diagnosis not present

## 2023-04-18 DIAGNOSIS — H01004 Unspecified blepharitis left upper eyelid: Secondary | ICD-10-CM | POA: Diagnosis not present

## 2023-06-08 DIAGNOSIS — M5412 Radiculopathy, cervical region: Secondary | ICD-10-CM | POA: Diagnosis not present

## 2023-07-20 DIAGNOSIS — M545 Low back pain, unspecified: Secondary | ICD-10-CM | POA: Diagnosis not present

## 2023-08-13 DIAGNOSIS — G510 Bell's palsy: Secondary | ICD-10-CM | POA: Diagnosis not present

## 2023-09-08 DIAGNOSIS — M5412 Radiculopathy, cervical region: Secondary | ICD-10-CM | POA: Diagnosis not present

## 2023-10-24 DIAGNOSIS — M5416 Radiculopathy, lumbar region: Secondary | ICD-10-CM | POA: Diagnosis not present

## 2023-11-24 DIAGNOSIS — Z23 Encounter for immunization: Secondary | ICD-10-CM | POA: Diagnosis not present

## 2023-11-24 DIAGNOSIS — I10 Essential (primary) hypertension: Secondary | ICD-10-CM | POA: Diagnosis not present

## 2023-11-24 DIAGNOSIS — E7800 Pure hypercholesterolemia, unspecified: Secondary | ICD-10-CM | POA: Diagnosis not present

## 2023-11-24 DIAGNOSIS — Z0000 Encounter for general adult medical examination without abnormal findings: Secondary | ICD-10-CM | POA: Diagnosis not present

## 2023-11-24 DIAGNOSIS — Z125 Encounter for screening for malignant neoplasm of prostate: Secondary | ICD-10-CM | POA: Diagnosis not present

## 2023-11-25 ENCOUNTER — Other Ambulatory Visit (HOSPITAL_BASED_OUTPATIENT_CLINIC_OR_DEPARTMENT_OTHER): Payer: Self-pay | Admitting: Family Medicine

## 2023-11-25 DIAGNOSIS — E7800 Pure hypercholesterolemia, unspecified: Secondary | ICD-10-CM

## 2023-12-15 ENCOUNTER — Ambulatory Visit (HOSPITAL_BASED_OUTPATIENT_CLINIC_OR_DEPARTMENT_OTHER)
Admission: RE | Admit: 2023-12-15 | Discharge: 2023-12-15 | Disposition: A | Discharge status: Home / Self Care | Payer: Self-pay | Source: Ambulatory Visit | Attending: Family Medicine | Admitting: Family Medicine

## 2023-12-15 DIAGNOSIS — E7800 Pure hypercholesterolemia, unspecified: Secondary | ICD-10-CM | POA: Insufficient documentation

## 2024-01-24 DIAGNOSIS — M5416 Radiculopathy, lumbar region: Secondary | ICD-10-CM | POA: Diagnosis not present

## 2024-01-24 DIAGNOSIS — M5412 Radiculopathy, cervical region: Secondary | ICD-10-CM | POA: Diagnosis not present
